# Patient Record
Sex: Male | Born: 1970 | Race: Black or African American | Hispanic: No | Marital: Single | Smoking: Former smoker
Health system: Southern US, Community
[De-identification: ages and names within clinical notes are randomized; demographics above are authoritative.]

## PROBLEM LIST (undated history)

## (undated) DIAGNOSIS — K649 Unspecified hemorrhoids: Secondary | ICD-10-CM

---

## 2013-10-01 ENCOUNTER — Emergency Department: Payer: Self-pay | Admitting: Emergency Medicine

## 2014-07-31 ENCOUNTER — Emergency Department: Payer: Self-pay | Admitting: Physician Assistant

## 2015-05-14 IMAGING — CR DG WRIST COMPLETE 3+V*R*
1 series · 4 of 4 positions shown · non-contrast
Comparison: None.

CLINICAL DATA: Pain status post trauma

EXAM:
RIGHT WRIST - COMPLETE 3+ VIEW

[Series 1: x wrist pa right · 0.14mm/px · 4 of 4 slices shown]
[im 1/4]
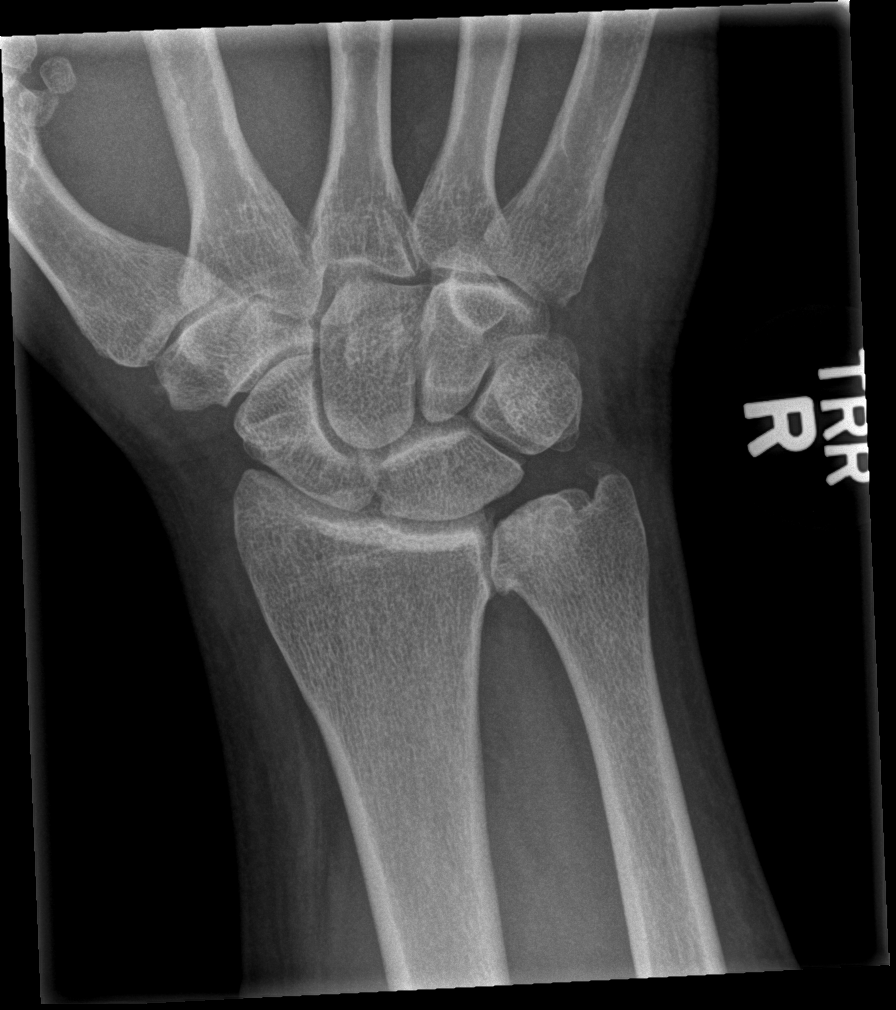
[im 2/4]
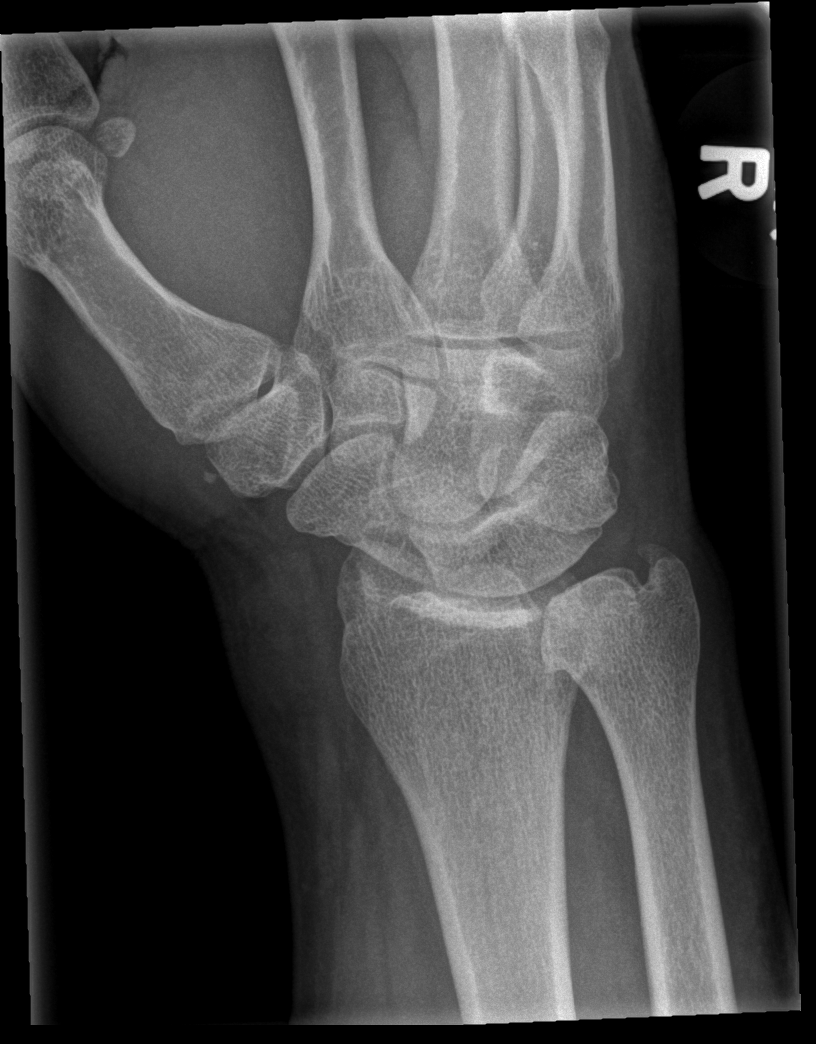
[im 3/4]
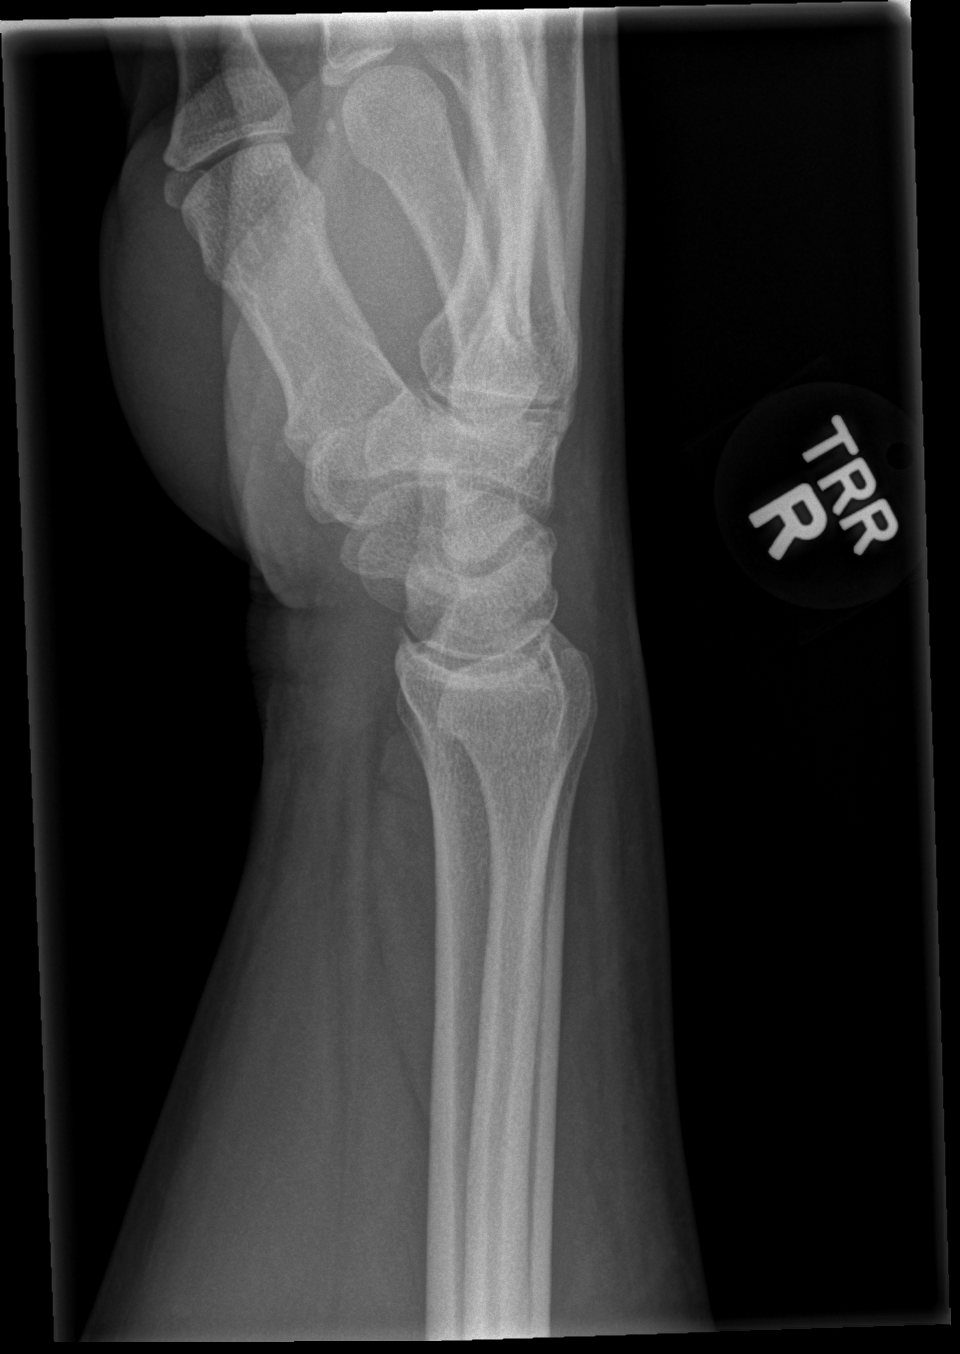
[im 4/4]
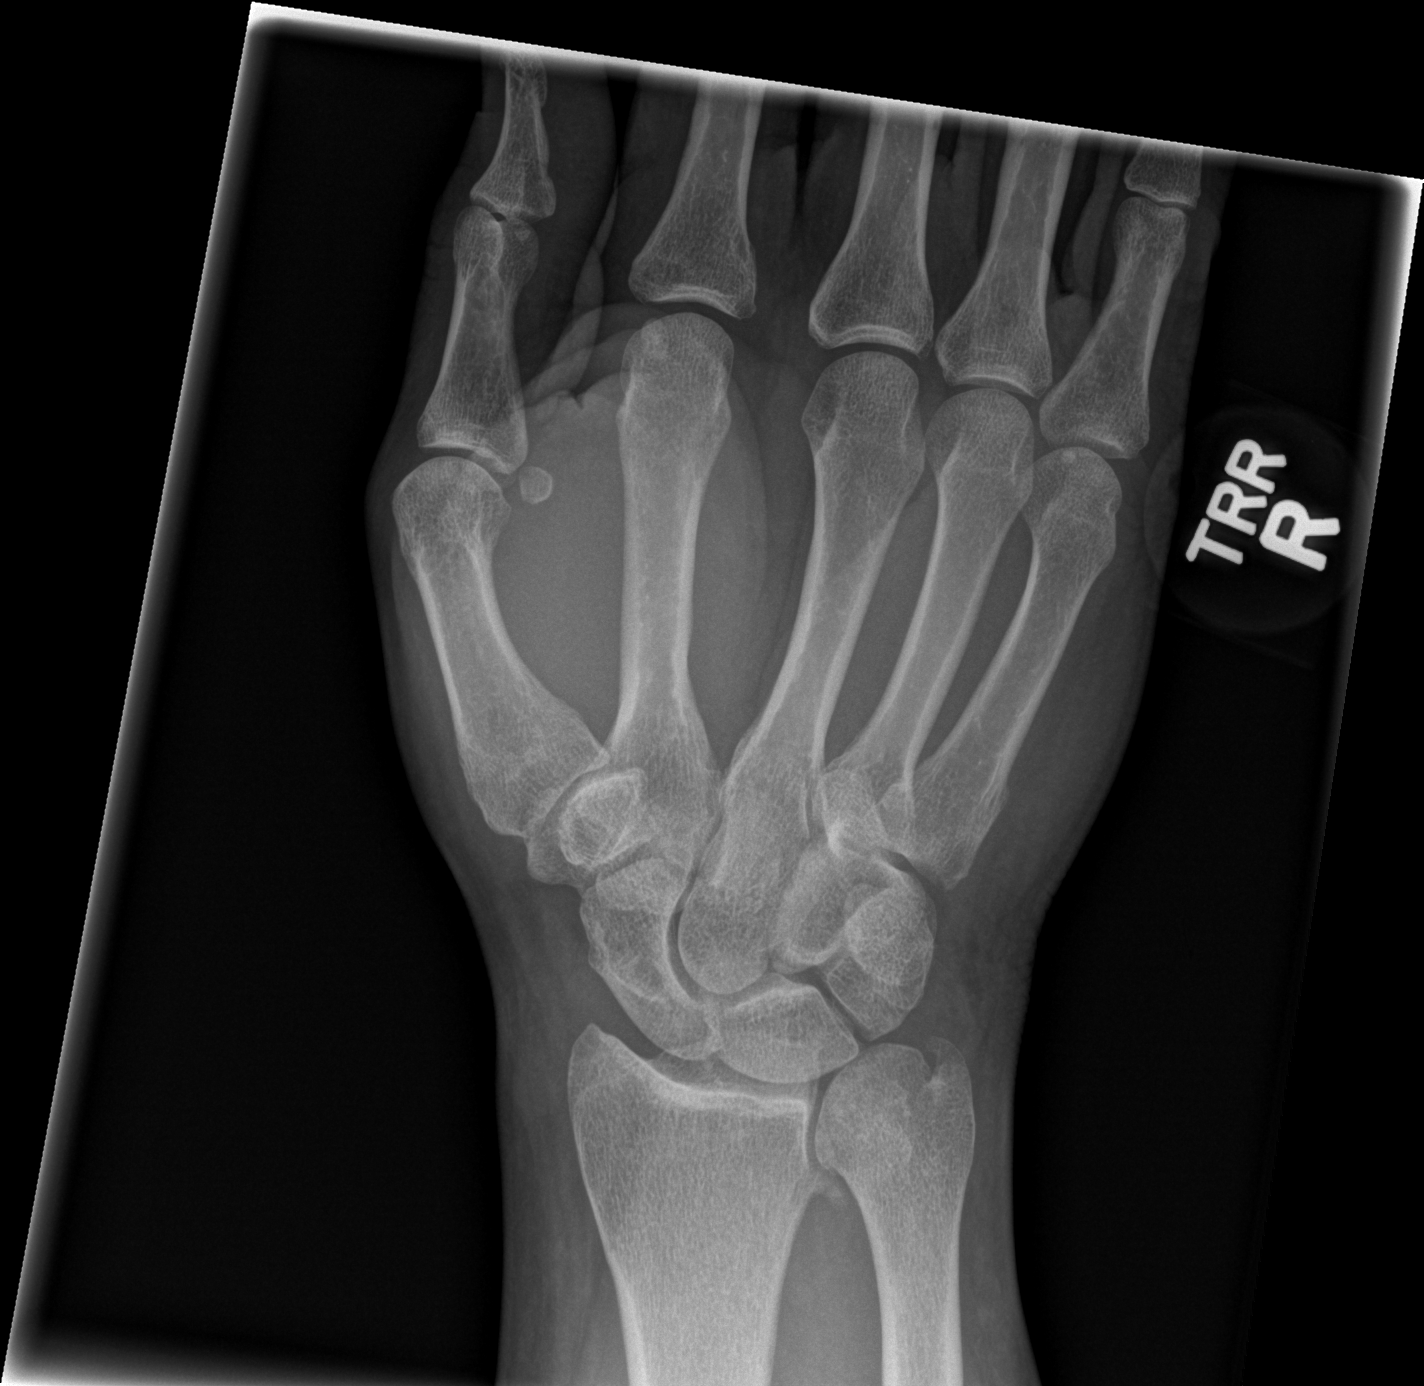

[4 of 4 positions shown; findings below may reference images not displayed]

FINDINGS: There is no evidence of fracture or dislocation. There is no
evidence of arthropathy or other focal bone abnormality. Soft
tissues are unremarkable. Punctate area of increased density within
the base of the soft tissues of the thenar regions likely represents
a small accessory ossicle. No radiopaque soft tissue foreign body
considering the patient's history cannot be excluded.
IMPRESSION: No evidence of acute osseous abnormalities.

## 2016-09-12 ENCOUNTER — Emergency Department
Admission: EM | Admit: 2016-09-12 | Discharge: 2016-09-12 | Disposition: A | Discharge status: Home / Self Care | Payer: Self-pay | Attending: Emergency Medicine | Admitting: Emergency Medicine

## 2016-09-12 ENCOUNTER — Encounter: Payer: Self-pay | Admitting: *Deleted

## 2016-09-12 DIAGNOSIS — H65111 Acute and subacute allergic otitis media (mucoid) (sanguinous) (serous), right ear: Secondary | ICD-10-CM

## 2016-09-12 DIAGNOSIS — H65191 Other acute nonsuppurative otitis media, right ear: Secondary | ICD-10-CM

## 2016-09-12 DIAGNOSIS — F17200 Nicotine dependence, unspecified, uncomplicated: Secondary | ICD-10-CM | POA: Insufficient documentation

## 2016-09-12 MED ORDER — AMOXICILLIN 500 MG PO CAPS
1000.0000 mg | ORAL_CAPSULE | Freq: Once | ORAL | Status: AC
  Administered 2016-09-12: 1000 mg via ORAL
  Filled 2016-09-12: qty 2

## 2016-09-12 MED ORDER — AMOXICILLIN 875 MG PO TABS: 875.0000 mg | ORAL_TABLET | Freq: Two times a day (BID) | ORAL | 0 refills | Status: DC

## 2016-09-12 NOTE — ED Provider Notes (Signed)
[facility] Emergency Department Provider Note  ____________________________________________  Time seen: Approximately 10:09 PM  I have reviewed the triage vital signs and the nursing notes.   HISTORY  Chief Complaint Chills and Otalgia    HPI Julian Brooks is a 46 y.o. male who presents to the ED with complaints of otalgia. He notes a 2 day history of right ear pain, subjective fever, headache, and cough. He denied any SOB, CP, N/V/D, or abdominal pain. He tried taking tylenol around 4 pm today without any relief. He did note sick contacts at work with influenza, and he did not receive his influenza vaccination.    No past medical history on file.  There are no active problems to display for this patient.   No past surgical history on file.  Prior to Admission medications   Medication Sig Start Date End Date Taking? Authorizing Provider  amoxicillin (AMOXIL) 875 MG tablet Take 1 tablet (875 mg total) by mouth 2 (two) times daily. 09/12/16   Delorise Royals Yaquelin Langelier, PA-C    Allergies Patient has no known allergies.  No family history on file.  Social History Social History  Substance Use Topics  . Smoking status: Current Every Day Smoker  . Smokeless tobacco: Current User  . Alcohol use No     Review of Systems  Constitutional: Positive for subjective fever, positive for body aches Eyes: No visual changes. No discharge ENT: Positive for otalgia Cardiovascular: no chest pain. Respiratory: positive for cough. No SOB. Gastrointestinal: No abdominal pain.  No nausea, no vomiting.  No diarrhea.  No constipation. Musculoskeletal: Negative for musculoskeletal pain. Skin: Negative for rash, abrasions, lacerations, ecchymosis. Neurological: Positive for Headache, Negative for focal weakness or numbness. 10-point ROS otherwise negative.  ____________________________________________   PHYSICAL EXAM:  VITAL SIGNS: ED Triage Vitals  Enc Vitals  Group     BP 09/12/16 2007 (!) 146/88     Pulse Rate 09/12/16 2007 (!) 105     Resp 09/12/16 2007 18     Temp 09/12/16 2007 98.7 F (37.1 C)     Temp Source 09/12/16 2007 Oral     SpO2 09/12/16 2007 98 %     Weight 09/12/16 2007 228 lb (103.4 kg)     Height 09/12/16 2007 5\' 9"  (1.753 m)     Head Circumference --      Peak Flow --      Pain Score 09/12/16 2010 6     Pain Loc --      Pain Edu? --      Excl. in GC? --      Constitutional: Alert and oriented. Well appearing and in no acute distress. Eyes: Conjunctivae are normal. PERRL. EOMI. Head: Atraumatic. ENT:      Ears: Right TM is erythematous with effusion, Left TM with effusion but without erythema.       Nose: No congestion/rhinnorhea.      Mouth/Throat: Mucous membranes are moist. No tonsilar exudates or erythema.  Neck: No stridor.   Lymphatic: No cervical lymphadenopathy Cardiovascular: Normal rate, regular rhythm. Normal S1 and S2.  Good peripheral circulation. Respiratory: Normal respiratory effort without tachypnea or retractions. Lungs CTAB. Good air entry to the bases with no decreased or absent breath sounds. Musculoskeletal: Full range of motion to all extremities. No gross deformities appreciated. Neurologic:  Normal speech and language. No gross focal neurologic deficits are appreciated.  Skin:  Skin is warm, dry and intact. No rash noted. Psychiatric: Mood and affect are normal.  Speech and behavior are normal. Patient exhibits appropriate insight and judgement.   ____________________________________________   LABS (all labs ordered are listed, but only abnormal results are displayed)  Labs Reviewed - No data to display ____________________________________________  EKG   ____________________________________________  RADIOLOGY   No results found.  ____________________________________________    PROCEDURES  Procedure(s) performed:    Procedures    Medications  amoxicillin (AMOXIL)  capsule 1,000 mg (not administered)     ____________________________________________   INITIAL IMPRESSION / ASSESSMENT AND PLAN / ED COURSE  Pertinent labs & imaging results that were available during my care of the patient were reviewed by me and considered in my medical decision making (see chart for details).  Review of the  CSRS was performed in accordance of the [facility] prior to dispensing any controlled drugs.     Patient's diagnosis is consistent with right-sided otitis media. Given first dose of antibiotics in the emergency department. Patient will be discharged home with prescriptions for amoxicillin. Patient is to follow up with primary care as needed or otherwise directed. Patient is given ED precautions to return to the ED for any worsening or new symptoms.     ____________________________________________  FINAL CLINICAL IMPRESSION(S) / ED DIAGNOSES  Final diagnoses:  Acute mucoid otitis media of right ear      NEW MEDICATIONS STARTED DURING THIS VISIT:  New Prescriptions   AMOXICILLIN (AMOXIL) 875 MG TABLET    Take 1 tablet (875 mg total) by mouth 2 (two) times daily.        This chart was dictated using voice recognition software/Dragon. Despite best efforts to proofread, errors can occur which can change the meaning. Any change was purely unintentional.    Racheal PatchesJonathan D Shanice Poznanski, PA-C 09/12/16 2230    Jene Everyobert Kinner, MD 09/12/16 [phone]

## 2016-09-12 NOTE — ED Triage Notes (Signed)
Pt has right earache and bodyaches with chills.  Pt alert.  Sx began yesterday.  Taking otc meds without relief.

## 2016-09-12 NOTE — ED Notes (Signed)
Pt stating that his sx began yesterday. Pt stating that he has right ear and eye pain, HA, chills, and generalized body aches. Pt stating a cough at time too. Pt also is presenting with nasal congestion. Pt stating that the HA is "nagging." Pt denying any known fevers at home except for the chills. Pt is laying on stretcher with his family at bedside.

## 2017-01-02 ENCOUNTER — Encounter: Payer: Self-pay | Admitting: Emergency Medicine

## 2017-01-02 ENCOUNTER — Emergency Department: Payer: Self-pay

## 2017-01-02 ENCOUNTER — Emergency Department
Admission: EM | Admit: 2017-01-02 | Discharge: 2017-01-02 | Disposition: A | Discharge status: Home / Self Care | Payer: Self-pay | Attending: Emergency Medicine | Admitting: Emergency Medicine

## 2017-01-02 DIAGNOSIS — F17200 Nicotine dependence, unspecified, uncomplicated: Secondary | ICD-10-CM | POA: Insufficient documentation

## 2017-01-02 DIAGNOSIS — T148XXA Other injury of unspecified body region, initial encounter: Secondary | ICD-10-CM

## 2017-01-02 DIAGNOSIS — Y929 Unspecified place or not applicable: Secondary | ICD-10-CM | POA: Insufficient documentation

## 2017-01-02 DIAGNOSIS — X500XXA Overexertion from strenuous movement or load, initial encounter: Secondary | ICD-10-CM | POA: Insufficient documentation

## 2017-01-02 DIAGNOSIS — Y9389 Activity, other specified: Secondary | ICD-10-CM | POA: Insufficient documentation

## 2017-01-02 DIAGNOSIS — S39012A Strain of muscle, fascia and tendon of lower back, initial encounter: Secondary | ICD-10-CM | POA: Insufficient documentation

## 2017-01-02 DIAGNOSIS — Y999 Unspecified external cause status: Secondary | ICD-10-CM | POA: Insufficient documentation

## 2017-01-02 MED ORDER — HYDROCODONE-ACETAMINOPHEN 5-325 MG PO TABS: 1.0000 | ORAL_TABLET | Freq: Four times a day (QID) | ORAL | 0 refills | Status: DC | PRN

## 2017-01-02 MED ORDER — LIDOCAINE 5 % EX PTCH
1.0000 | MEDICATED_PATCH | CUTANEOUS | Status: DC
  Administered 2017-01-02: 1 via TRANSDERMAL
  Filled 2017-01-02: qty 1

## 2017-01-02 MED ORDER — ACETAMINOPHEN 325 MG PO TABS
650.0000 mg | ORAL_TABLET | Freq: Once | ORAL | Status: AC
  Administered 2017-01-02: 650 mg via ORAL
  Filled 2017-01-02: qty 2

## 2017-01-02 MED ORDER — KETOROLAC TROMETHAMINE 60 MG/2ML IM SOLN
30.0000 mg | Freq: Once | INTRAMUSCULAR | Status: AC
  Administered 2017-01-02: 30 mg via INTRAMUSCULAR
  Filled 2017-01-02: qty 2

## 2017-01-02 NOTE — ED Triage Notes (Signed)
Pt was lifting washer by himself 2 days ago and now c/o lower back pain. Worse when moving/walking.  No loss bowel or bladder.

## 2017-01-02 NOTE — ED Provider Notes (Signed)
[facility] Emergency Department Provider Note  ____________________________________________  Time seen: Approximately 1:12 PM  I have reviewed the triage vital signs and the nursing notes.   HISTORY  Chief Complaint Back Pain    HPI Julian Brooks is a 46 y.o. male that presents to the emergency department with low back pain for 2 days. Patient states that he was lifting a washer by himself and immediately had pain after.Pain is in his low back in the center and does not radiate. He has not had difficulties with his back previously. He took "small pills" that he had at work for pain, which did not help. He denies headache, shortness of breath, chest pain, nausea, vomiting, abdominal pain, bowel or bladder dysfunction, saddle paresthesias.   History reviewed. No pertinent past medical history.  There are no active problems to display for this patient.   History reviewed. No pertinent surgical history.  Prior to Admission medications   Medication Sig Start Date End Date Taking? Authorizing Provider  amoxicillin (AMOXIL) 875 MG tablet Take 1 tablet (875 mg total) by mouth 2 (two) times daily. 09/12/16   Cuthriell, Delorise Royals, PA-C  HYDROcodone-acetaminophen (NORCO/VICODIN) 5-325 MG tablet Take 1 tablet by mouth every 6 (six) hours as needed for moderate pain. 01/02/17   Enid Derry, PA-C    Allergies Patient has no known allergies.  History reviewed. No pertinent family history.  Social History Social History  Substance Use Topics  . Smoking status: Current Every Day Smoker  . Smokeless tobacco: Current User  . Alcohol use No     Review of Systems  Constitutional: No fever/chills Cardiovascular: No chest pain. Respiratory: No SOB. Gastrointestinal: No abdominal pain.  No nausea, no vomiting.  Musculoskeletal: Positive for back pain. Skin: Negative for rash, abrasions, lacerations, ecchymosis. Neurological: Negative for headaches, numbness or  tingling   ____________________________________________   PHYSICAL EXAM:  VITAL SIGNS: ED Triage Vitals  Enc Vitals Group     BP 01/02/17 1223 113/81     Pulse Rate 01/02/17 1223 77     Resp 01/02/17 1223 20     Temp 01/02/17 1223 98.2 F (36.8 C)     Temp Source 01/02/17 1223 Oral     SpO2 01/02/17 1223 96 %     Weight 01/02/17 1220 228 lb (103.4 kg)     Height 01/02/17 1220 5\' 9"  (1.753 m)     Head Circumference --      Peak Flow --      Pain Score 01/02/17 1219 7     Pain Loc --      Pain Edu? --      Excl. in GC? --      Constitutional: Alert and oriented. Well appearing and in no acute distress. Eyes: Conjunctivae are normal. PERRL. EOMI. Head: Atraumatic. ENT:      Ears:      Nose: No congestion/rhinnorhea.      Mouth/Throat: Mucous membranes are moist.  Neck: No stridor. Cardiovascular: Normal rate, regular rhythm.  Good peripheral circulation. Respiratory: Normal respiratory effort without tachypnea or retractions. Lungs CTAB. Good air entry to the bases with no decreased or absent breath sounds. Gastrointestinal: Bowel sounds 4 quadrants. Soft and nontender to palpation. No guarding or rigidity. No palpable masses. No distention.  Musculoskeletal: Full range of motion to all extremities. No gross deformities appreciated. Tenderness to palpation over lumbar spine and paraspinal muscles.. Neurologic:  Normal speech and language. No gross focal neurologic deficits are appreciated.  Skin:  Skin is warm, dry and intact. No rash noted. Psychiatric: Mood and affect are normal. Speech and behavior are normal. Patient exhibits appropriate insight and judgement.   ____________________________________________   LABS (all labs ordered are listed, but only abnormal results are displayed)  Labs Reviewed - No data to display ____________________________________________  EKG   ____________________________________________  RADIOLOGY Lexine BatonI, Hadriel Northup, personally  viewed and evaluated these images (plain radiographs) as part of my medical decision making, as well as reviewing the written report by the radiologist.  Dg Lumbar Spine Complete  Result Date: 01/02/2017 CLINICAL DATA:  Lifted heavy washer 2 days ago. Now with low back pain, worse with movement. EXAM: LUMBAR SPINE - COMPLETE 4+ VIEW COMPARISON:  None. FINDINGS: Five non rib-bearing lumbar-type vertebral bodies are intact and aligned with straightened at lumbar lordosis. Intervertebral disc heights are normal, multilevel ventral endplate spurring. Asymmetrically dense RIGHT L5 vertebral body. Sacroiliac joints are symmetric. Included prevertebral and paraspinal soft tissue planes are non-suspicious. Rounded calcification projecting RIGHT upper quadrant suggest cholelithiasis. IMPRESSION: No acute fracture deformity or malalignment. Probable bone island L5 though, he of there is a history of cancer consider bone scan. Suspected cholelithiasis. Electronically Signed   By: Awilda Metroourtnay  Bloomer M.D.   On: 01/02/2017 13:44    ____________________________________________    PROCEDURES  Procedure(s) performed:    Procedures    Medications  lidocaine (LIDODERM) 5 % 1 patch (1 patch Transdermal Patch Applied 01/02/17 1312)  acetaminophen (TYLENOL) tablet 650 mg (650 mg Oral Given 01/02/17 1312)  ketorolac (TORADOL) injection 30 mg (30 mg Intramuscular Given 01/02/17 1423)     ____________________________________________   INITIAL IMPRESSION / ASSESSMENT AND PLAN / ED COURSE  Pertinent labs & imaging results that were available during my care of the patient were reviewed by me and considered in my medical decision making (see chart for details).  Review of the [place] CSRS was performed in accordance of the [facility] prior to dispensing any controlled drugs.  Patient's diagnosis is consistent with musculoskeletal pain. Vital signs and exam are reassuring. X-ray negative for acute bony abnormalities. Findings  of probable bony [place] were discussed with patient. Patient does not have a history of cancer. He will follow-up with his PCP regarding this. No bowel or bladder dysfunction or saddle paresthesias. He felt better after Toradol and Tylenol. Patient will be discharged home with prescriptions for a short course of Vicodin. Patient is to follow up with PCP as directed. Patient is given ED precautions to return to the ED for any worsening or new symptoms.     ____________________________________________  FINAL CLINICAL IMPRESSION(S) / ED DIAGNOSES  Final diagnoses:  Muscle strain      NEW MEDICATIONS STARTED DURING THIS VISIT:  Discharge Medication List as of 01/02/2017  3:05 PM    START taking these medications   Details  HYDROcodone-acetaminophen (NORCO/VICODIN) 5-325 MG tablet Take 1 tablet by mouth every 6 (six) hours as needed for moderate pain., Starting Thu 01/02/2017, Print            This chart was dictated using voice recognition software/Dragon. Despite best efforts to proofread, errors can occur which can change the meaning. Any change was purely unintentional.    Enid DerryWagner, Khira Cudmore, PA-C 01/02/17 1557    Governor RooksLord, Rebecca, MD 01/12/17 1120

## 2017-04-24 ENCOUNTER — Encounter: Payer: Self-pay | Admitting: Emergency Medicine

## 2017-04-24 ENCOUNTER — Emergency Department
Admission: EM | Admit: 2017-04-24 | Discharge: 2017-04-24 | Disposition: A | Discharge status: Home / Self Care | Payer: Self-pay | Attending: Emergency Medicine | Admitting: Emergency Medicine

## 2017-04-24 DIAGNOSIS — Y990 Civilian activity done for income or pay: Secondary | ICD-10-CM | POA: Insufficient documentation

## 2017-04-24 DIAGNOSIS — F17200 Nicotine dependence, unspecified, uncomplicated: Secondary | ICD-10-CM | POA: Insufficient documentation

## 2017-04-24 DIAGNOSIS — X58XXXA Exposure to other specified factors, initial encounter: Secondary | ICD-10-CM | POA: Insufficient documentation

## 2017-04-24 DIAGNOSIS — Y929 Unspecified place or not applicable: Secondary | ICD-10-CM | POA: Insufficient documentation

## 2017-04-24 DIAGNOSIS — S39012A Strain of muscle, fascia and tendon of lower back, initial encounter: Secondary | ICD-10-CM | POA: Insufficient documentation

## 2017-04-24 DIAGNOSIS — Y9389 Activity, other specified: Secondary | ICD-10-CM | POA: Insufficient documentation

## 2017-04-24 MED ORDER — CYCLOBENZAPRINE HCL 5 MG PO TABS: 5.0000 mg | ORAL_TABLET | Freq: Every day | ORAL | 0 refills | Status: DC

## 2017-04-24 MED ORDER — DICLOFENAC SODIUM 75 MG PO TBEC: 75.0000 mg | DELAYED_RELEASE_TABLET | Freq: Two times a day (BID) | ORAL | 1 refills | Status: DC

## 2017-04-24 MED ORDER — DICLOFENAC SODIUM 75 MG PO TBEC
75.0000 mg | DELAYED_RELEASE_TABLET | Freq: Once | ORAL | Status: AC
  Administered 2017-04-24: 75 mg via ORAL
  Filled 2017-04-24 (×2): qty 1

## 2017-04-24 NOTE — ED Triage Notes (Signed)
Patient presents to the ED with lower back pain that is worse this am.  Patient reports chronic lower back pain.  Patient states he has history of "bone spurs".

## 2017-04-24 NOTE — ED Notes (Signed)
NAD noted at time of D/C. Pt denies questions or concerns. Pt ambulatory to the lobby at this time.  

## 2017-04-24 NOTE — Discharge Instructions (Signed)
Your exam is essentially normal at this time. You have some muscle strain with underlying mild arthritis.Take the prescription meds as directed. [facility] for [facility] symptoms.

## 2017-04-24 NOTE — ED Provider Notes (Signed)
[facility] Emergency Department Provider Note ____________________________________________  Time seen: 1113  I have reviewed the triage vital signs and the nursing notes.  HISTORY  Chief Complaint  Back Pain  HPI Julian Brooks is a 46 y.o. male sent to the ED with complaints of low back pain that is worsened after his shift yesterday.  Patient denies any particular injury, accident, or trauma.  He reports he has not had any interim back pain since he was seen in July for a mechanical injury.  He has been taken Tylenol in the interim but denies any significant benefit.  He denies any distal paresthesias, foot drop, or incontinence.  He describes pain to the midline of the low back that is tender to palpation.  History reviewed. No pertinent past medical history.  There are no active problems to display for this patient.  History reviewed. No pertinent surgical history.  Prior to Admission medications   Medication Sig Start Date End Date Taking? Authorizing Provider  amoxicillin (AMOXIL) 875 MG tablet Take 1 tablet (875 mg total) by mouth 2 (two) times daily. 09/12/16   Cuthriell, Delorise RoyalsJonathan D, PA-C  cyclobenzaprine (FLEXERIL) 5 MG tablet Take 1 tablet (5 mg total) by mouth at bedtime. 04/24/17   Elfrida Pixley, Charlesetta IvoryJenise V Bacon, PA-C  diclofenac (VOLTAREN) 75 MG EC tablet Take 1 tablet (75 mg total) by mouth 2 (two) times daily. 04/24/17   Arkel Cartwright, Charlesetta IvoryJenise V Bacon, PA-C  HYDROcodone-acetaminophen (NORCO/VICODIN) 5-325 MG tablet Take 1 tablet by mouth every 6 (six) hours as needed for moderate pain. 01/02/17   Enid DerryWagner, Ashley, PA-C    Allergies Patient has no known allergies.  No family history on file.  Social History Social History  Substance Use Topics  . Smoking status: Current Every Day Smoker  . Smokeless tobacco: Current User  . Alcohol use No    Review of Systems  Constitutional: Negative for fever. Cardiovascular: Negative for chest pain. Respiratory:  Negative for shortness of breath. Gastrointestinal: Negative for abdominal pain, vomiting and diarrhea. Genitourinary: Negative for dysuria. Musculoskeletal: Positive for back pain. Skin: Negative for rash. Neurological: Negative for headaches, focal weakness or numbness. ____________________________________________  PHYSICAL EXAM:  VITAL SIGNS: ED Triage Vitals  Enc Vitals Group     BP 04/24/17 1029 113/77     Pulse Rate 04/24/17 1029 97     Resp 04/24/17 1029 16     Temp 04/24/17 1029 98.3 F (36.8 C)     Temp Source 04/24/17 1029 Oral     SpO2 04/24/17 1029 99 %     Weight 04/24/17 1021 209 lb (94.8 kg)     Height 04/24/17 1021 5\' 9"  (1.753 m)     Head Circumference --      Peak Flow --      Pain Score 04/24/17 1020 7     Pain Loc --      Pain Edu? --      Excl. in GC? --    Constitutional: Alert and oriented. Well appearing and in no distress. Head: Normocephalic and atraumatic. Cardiovascular: Normal rate, regular rhythm. Normal distal pulses. Respiratory: Normal respiratory effort. No wheezes/rales/rhonchi. Gastrointestinal: Soft and nontender. No distention. Musculoskeletal: Normal spinal alignment without midline tenderness, spasm, deformity, or step-off.  Patient with normal transition from sit to stand.  Normal lumbar extension with some limited lumbar flexion.  He is able to temperature normal toe and heel raise on exam.  Nontender with normal range of motion in all extremities.  Neurologic: Cranial nerves  II through XII grossly intact.  Normal LE DTRs bilaterally.  Normal gait without ataxia. Normal speech and language. No gross focal neurologic deficits are appreciated. Skin:  Skin is warm, dry and intact. No rash noted. ____________________________________________  PROCEDURES  Voltaren 75 mg PO ____________________________________________  INITIAL IMPRESSION / ASSESSMENT AND PLAN / ED COURSE  Presents to the ED for evaluation of acute low back pain.   Patient with what appears to be musculoskeletal pain to the lumbar sacral region.  His exam is overall benign without any acute neuromuscular deficit.  Patient is discharged with a prescription for Voltaren and Flexeril.  Work note is provided for 2 days as requested.  He will follow-up with his provider at [facility] for ongoing symptom management.  Return precautions are reviewed.  ____________________________________________  FINAL CLINICAL IMPRESSION(S) / ED DIAGNOSES  Final diagnoses:  Strain of lumbar region, initial encounter      Lissa Hoard, PA-C 04/24/17 1152    Myrna Blazer, MD 04/24/17 [phone]

## 2017-06-20 ENCOUNTER — Other Ambulatory Visit: Payer: Self-pay

## 2017-06-20 ENCOUNTER — Emergency Department
Admission: EM | Admit: 2017-06-20 | Discharge: 2017-06-20 | Disposition: A | Discharge status: Home / Self Care | Payer: Self-pay | Attending: Emergency Medicine | Admitting: Emergency Medicine

## 2017-06-20 ENCOUNTER — Encounter: Payer: Self-pay | Admitting: Emergency Medicine

## 2017-06-20 DIAGNOSIS — Z79899 Other long term (current) drug therapy: Secondary | ICD-10-CM | POA: Insufficient documentation

## 2017-06-20 DIAGNOSIS — K641 Second degree hemorrhoids: Secondary | ICD-10-CM | POA: Insufficient documentation

## 2017-06-20 DIAGNOSIS — Z87891 Personal history of nicotine dependence: Secondary | ICD-10-CM | POA: Insufficient documentation

## 2017-06-20 HISTORY — DX: Unspecified hemorrhoids: K64.9

## 2017-06-20 MED ORDER — LIDOCAINE HCL 2 % EX GEL
1.0000 "application " | Freq: Once | CUTANEOUS | Status: DC
  Filled 2017-06-20: qty 10

## 2017-06-20 MED ORDER — LIDOCAINE 4 % EX CREA: 1.0000 "application " | TOPICAL_CREAM | CUTANEOUS | 0 refills | Status: DC | PRN

## 2017-06-20 MED ORDER — HYDROCORTISONE ACETATE 25 MG RE SUPP: 25.0000 mg | Freq: Two times a day (BID) | RECTAL | 1 refills | Status: DC

## 2017-06-20 MED ORDER — DOCUSATE SODIUM 100 MG PO CAPS: 100.0000 mg | ORAL_CAPSULE | Freq: Every day | ORAL | 2 refills | Status: AC | PRN

## 2017-06-20 NOTE — ED Notes (Signed)
Pt ambulatory upon discharge. Verbalized understanding of discharge instructions, prescription and follow-up care if symptoms worsen. VSS. Skin warm and dry. A&O x4.

## 2017-06-20 NOTE — ED Provider Notes (Signed)
[facility] Emergency Department Provider Note   ____________________________________________   First MD Initiated Contact with Patient 06/20/17 1313     (approximate)  I have reviewed the triage vital signs and the nursing notes.   HISTORY  Chief Complaint Hemorrhoids    HPI Clerance Julian Brooks is a 46 y.o. male patient complaining of hemorrhoidal pain for 2 days. Patient noted relief with over-the-counter products. Patient rates his pain as a 8/10. Patient described a pain as "throbbing".   Past Medical History:  Diagnosis Date  . Hemorrhoid     There are no active problems to display for this patient.   History reviewed. No pertinent surgical history.  Prior to Admission medications   Medication Sig Start Date End Date Taking? Authorizing Provider  amoxicillin (AMOXIL) 875 MG tablet Take 1 tablet (875 mg total) by mouth 2 (two) times daily. 09/12/16   Cuthriell, Delorise RoyalsJonathan D, PA-C  cyclobenzaprine (FLEXERIL) 5 MG tablet Take 1 tablet (5 mg total) by mouth at bedtime. 04/24/17   Menshew, Charlesetta IvoryJenise V Bacon, PA-C  diclofenac (VOLTAREN) 75 MG EC tablet Take 1 tablet (75 mg total) by mouth 2 (two) times daily. 04/24/17   Menshew, Charlesetta IvoryJenise V Bacon, PA-C  docusate sodium (COLACE) 100 MG capsule Take 1 capsule (100 mg total) by mouth daily as needed. 06/20/17 06/20/18  Joni ReiningSmith, Ronald K, PA-C  HYDROcodone-acetaminophen (NORCO/VICODIN) 5-325 MG tablet Take 1 tablet by mouth every 6 (six) hours as needed for moderate pain. 01/02/17   Enid DerryWagner, Ashley, PA-C  hydrocortisone (ANUSOL-HC) 25 MG suppository Place 1 suppository (25 mg total) rectally every 12 (twelve) hours. 06/20/17 06/20/18  Joni ReiningSmith, Ronald K, PA-C  lidocaine (LMX) 4 % cream Apply 1 application topically as needed. 06/20/17   Joni ReiningSmith, Ronald K, PA-C    Allergies Patient has no known allergies.  History reviewed. No pertinent family history.  Social History Social History   Tobacco Use  . Smoking status:  Former Smoker    Types: Cigarettes  . Smokeless tobacco: Former Engineer, waterUser  Substance Use Topics  . Alcohol use: No  . Drug use: Not on file    Review of Systems Constitutional: No fever/chills Eyes: No visual changes. ENT: No sore throat. Cardiovascular: Denies chest pain. Respiratory: Denies shortness of breath. Gastrointestinal: No abdominal pain.  No nausea, no vomiting.  No diarrhea.  No constipation. Protruding hemorrhoid Genitourinary: Negative for dysuria. Musculoskeletal: Negative for back pain. Skin: Negative for rash. Neurological: Negative for headaches, focal weakness or numbness.   ____________________________________________   PHYSICAL EXAM:  VITAL SIGNS: ED Triage Vitals  Enc Vitals Group     BP 06/20/17 1144 137/80     Pulse Rate 06/20/17 1144 95     Resp 06/20/17 1144 18     Temp 06/20/17 1144 98.2 F (36.8 C)     Temp Source 06/20/17 1142 Oral     SpO2 06/20/17 1144 98 %     Weight 06/20/17 1144 201 lb (91.2 kg)     Height 06/20/17 1144 5\' 9"  (1.753 m)     Head Circumference --      Peak Flow --      Pain Score 06/20/17 1145 8     Pain Loc --      Pain Edu? --      Excl. in GC? --    Constitutional: Alert and oriented. Well appearing and in no acute distress. Cardiovascular: Normal rate, regular rhythm. Grossly normal heart sounds.  Good peripheral circulation. Respiratory: Normal respiratory effort.  No retractions. Lungs CTAB. Gastrointestinal: Soft and nontender. No distention. No abdominal bruits. No CVA tenderness. Protruding non-thrombosed hemorrhoidal tissue. ____________________________________________   LABS (all labs ordered are listed, but only abnormal results are displayed)  Labs Reviewed - No data to display ____________________________________________  EKG   ____________________________________________  RADIOLOGY  No results found.  ____________________________________________   PROCEDURES  Procedure(s) performed:  None  Procedures  Critical Care performed: No  ____________________________________________   INITIAL IMPRESSION / ASSESSMENT AND PLAN / ED COURSE  As part of my medical decision making, I reviewed the following data within the electronic MEDICAL RECORD NUMBER    Acute rectal hemorrhoid; non-thrombosed. Tissue was easily reduced using topical 2% lidocaine. Patient given discharge care instructions. Patient given a prescription for Anusol and topical lidocaine. Patient also advised to use Colace as a stool softener. Follow-up with the surgical clinic if condition persists.      ____________________________________________   FINAL CLINICAL IMPRESSION(S) / ED DIAGNOSES  Final diagnoses:  Grade II hemorrhoids     ED Discharge Orders        Ordered    hydrocortisone (ANUSOL-HC) 25 MG suppository  Every 12 hours     06/20/17 1324    lidocaine (LMX) 4 % cream  As needed     06/20/17 1324    docusate sodium (COLACE) 100 MG capsule  Daily PRN     06/20/17 1324       Note:  This document was prepared using Dragon voice recognition software and may include unintentional dictation errors.    Joni ReiningSmith, Ronald K, PA-C 06/20/17 1337    Arnaldo NatalMalinda, Paul F, MD 06/20/17 [phone]

## 2017-06-20 NOTE — ED Triage Notes (Signed)
Pt has hx of hemorrhoids, pain started about 2 days ago, no hx of excision of hemorrhoids, tried OTC products - preparation H with no relief.

## 2017-06-20 NOTE — ED Notes (Signed)
Pt reports having hemorrhoids but the pain got a lot worse this morning when he woke up. Denies straining for BM.

## 2017-06-20 NOTE — Discharge Instructions (Signed)
Following discharge care instructions and use medications as directed. Follow-up with the surgical clinic if no improvement in one week.

## 2017-08-07 ENCOUNTER — Encounter: Payer: Self-pay | Admitting: Emergency Medicine

## 2017-08-07 ENCOUNTER — Emergency Department
Admission: EM | Admit: 2017-08-07 | Discharge: 2017-08-07 | Disposition: A | Discharge status: Home / Self Care | Payer: Self-pay | Attending: Student in an Organized Health Care Education/Training Program | Admitting: Student in an Organized Health Care Education/Training Program

## 2017-08-07 ENCOUNTER — Other Ambulatory Visit: Payer: Self-pay

## 2017-08-07 DIAGNOSIS — Z79899 Other long term (current) drug therapy: Secondary | ICD-10-CM | POA: Insufficient documentation

## 2017-08-07 DIAGNOSIS — J111 Influenza due to unidentified influenza virus with other respiratory manifestations: Secondary | ICD-10-CM | POA: Insufficient documentation

## 2017-08-07 DIAGNOSIS — Z87891 Personal history of nicotine dependence: Secondary | ICD-10-CM | POA: Insufficient documentation

## 2017-08-07 MED ORDER — ACETAMINOPHEN-CODEINE #3 300-30 MG PO TABS
1.0000 | ORAL_TABLET | Freq: Once | ORAL | Status: AC
  Administered 2017-08-07: 1 via ORAL
  Filled 2017-08-07: qty 1

## 2017-08-07 MED ORDER — ACETAMINOPHEN-CODEINE #3 300-30 MG PO TABS: 1.0000 | ORAL_TABLET | Freq: Four times a day (QID) | ORAL | 0 refills | Status: DC | PRN

## 2017-08-07 MED ORDER — BENZONATATE 100 MG PO CAPS: ORAL_CAPSULE | ORAL | 0 refills | Status: DC

## 2017-08-07 MED ORDER — ONDANSETRON 4 MG PO TBDP: 4.0000 mg | ORAL_TABLET | Freq: Three times a day (TID) | ORAL | 0 refills | Status: DC | PRN

## 2017-08-07 NOTE — ED Notes (Signed)
See triage note  Presents with sore throat and body aches for the past couple of days   Low grade fever on arrival

## 2017-08-07 NOTE — ED Provider Notes (Signed)
[facility] Emergency Department Provider Note ____________________________________________  Time seen: 1450  I have reviewed the triage vital signs and the nursing notes.  HISTORY  Chief Complaint  Sore Throat; Nasal Congestion; and Chills  HPI Julian Brooks is a 47 y.o. male into the ED for evaluation of sudden onset of sore throat, congestion, chills, and body aches.  Patient did not receive the seasonal flu vaccine, reports similar symptoms in his coworkers.  He describes fatigue, malaise, shortness of breath, and intermittent cough.  He denies any nausea, vomiting, or abdominal pain.  Past Medical History:  Diagnosis Date  . Hemorrhoid     There are no active problems to display for this patient.   History reviewed. No pertinent surgical history.  Prior to Admission medications   Medication Sig Start Date End Date Taking? Authorizing Provider  acetaminophen-codeine (TYLENOL #3) 300-30 MG tablet Take 1 tablet by mouth every 6 (six) hours as needed for moderate pain. 08/07/17   Patrcia Schnepp, Charlesetta IvoryJenise V Bacon, PA-C  amoxicillin (AMOXIL) 875 MG tablet Take 1 tablet (875 mg total) by mouth 2 (two) times daily. 09/12/16   Cuthriell, Delorise RoyalsJonathan D, PA-C  benzonatate (TESSALON PERLES) 100 MG capsule Take 1-2 tabs TID prn cough 08/07/17   Asyria Kolander, Charlesetta IvoryJenise V Bacon, PA-C  cyclobenzaprine (FLEXERIL) 5 MG tablet Take 1 tablet (5 mg total) by mouth at bedtime. 04/24/17   Catarina Huntley, Charlesetta IvoryJenise V Bacon, PA-C  diclofenac (VOLTAREN) 75 MG EC tablet Take 1 tablet (75 mg total) by mouth 2 (two) times daily. 04/24/17   Adolphus Hanf, Charlesetta IvoryJenise V Bacon, PA-C  docusate sodium (COLACE) 100 MG capsule Take 1 capsule (100 mg total) by mouth daily as needed. 06/20/17 06/20/18  Joni ReiningSmith, Ronald K, PA-C  HYDROcodone-acetaminophen (NORCO/VICODIN) 5-325 MG tablet Take 1 tablet by mouth every 6 (six) hours as needed for moderate pain. 01/02/17   Enid DerryWagner, Ashley, PA-C  hydrocortisone (ANUSOL-HC) 25 MG suppository Place 1  suppository (25 mg total) rectally every 12 (twelve) hours. 06/20/17 06/20/18  Joni ReiningSmith, Ronald K, PA-C  lidocaine (LMX) 4 % cream Apply 1 application topically as needed. 06/20/17   Joni ReiningSmith, Ronald K, PA-C  ondansetron (ZOFRAN ODT) 4 MG disintegrating tablet Take 1 tablet (4 mg total) by mouth every 8 (eight) hours as needed. 08/07/17   Marshawn Ninneman, Charlesetta IvoryJenise V Bacon, PA-C    Allergies Patient has no known allergies.  No family history on file.  Social History Social History   Tobacco Use  . Smoking status: Former Smoker    Types: Cigarettes  . Smokeless tobacco: Former Engineer, waterUser  Substance Use Topics  . Alcohol use: No  . Drug use: Not on file    Review of Systems  Constitutional: positive for fever and chils. Eyes: Negative for visual changes. ENT: Positive for sore throat. Cardiovascular: Negative for chest pain. Respiratory: Negative for shortness of breath. Gastrointestinal: Negative for abdominal pain, vomiting and diarrhea. Musculoskeletal: Negative for back pain. Skin: Negative for rash. Neurological: Negative for headaches, focal weakness or numbness. ____________________________________________  PHYSICAL EXAM:  VITAL SIGNS: ED Triage Vitals  Enc Vitals Group     BP 08/07/17 1249 97/61     Pulse Rate 08/07/17 1249 (!) 105     Resp 08/07/17 1249 17     Temp 08/07/17 1249 99.7 F (37.6 C)     Temp Source 08/07/17 1249 Oral     SpO2 08/07/17 1249 98 %     Weight 08/07/17 1250 194 lb (88 kg)     Height 08/07/17 1250 5'  8" (1.727 m)     Head Circumference --      Peak Flow --      Pain Score 08/07/17 1305 6     Pain Loc --      Pain Edu? --      Excl. in GC? --     Constitutional: Alert and oriented. Well appearing and in no distress. Head: Normocephalic and atraumatic. Eyes: Conjunctivae are normal. PERRL. Normal extraocular movements Ears: Canals clear. TMs intact bilaterally. Nose: No congestion/rhinorrhea/epistaxis.  Mouth/Throat: Mucous membranes are moist.  Uvula  is midline and tonsils are flat.  No oropharyngeal lesions appreciated.  Generalized erythema is noted. Neck: Supple. No thyromegaly. Hematological/Lymphatic/Immunological: No cervical lymphadenopathy. Cardiovascular: Normal rate, regular rhythm. No murmurs, rubs, or gallops.  Normal distal pulses. Respiratory: Normal respiratory effort. No wheezes/rales/rhonchi. Gastrointestinal: Soft and nontender. No distention. Musculoskeletal: Nontender with normal range of motion in all extremities.  Neurologic:  Normal gait without ataxia. Normal speech and language. No gross focal neurologic deficits are appreciated. Skin:  Skin is warm, dry and intact. No rash noted. ___________________________________________  PROCEDURES  Procedures Tylenol #3 i PO ____________________________________________  INITIAL IMPRESSION / ASSESSMENT AND PLAN / ED COURSE  Patient with ED evaluation of sudden onset of subjective fevers, chills, sore throat, body aches.  Patient clinical picture is consistent with influenza.  Patient has declined influenza screen at this time.  He will be discharged with prescriptions for Tylenol with codeine, Tessalon Perles, and Zofran.  A work note is provided for the remainder of this week.  He should follow-up with his primary provider or return to the ED as discussed. ____________________________________________  FINAL CLINICAL IMPRESSION(S) / ED DIAGNOSES  Final diagnoses:  Influenza      Karmen Stabs, Charlesetta Ivory, PA-C 08/07/17 1525    Willy Eddy, MD 08/07/17 1711

## 2017-08-07 NOTE — Discharge Instructions (Signed)
Your sudden symptoms are consistent with influenza (flu). You should take the prescription meds as directed. Take OTC ibuprofen and cough medicine for additional symptom relief. Drink Powerade/Gatorade to prevent dehydration. [facility] or return as needed.

## 2017-08-07 NOTE — ED Triage Notes (Signed)
Says throat, head congestiln and chills for 2 days.

## 2017-08-09 ENCOUNTER — Emergency Department: Payer: No Typology Code available for payment source

## 2017-08-09 ENCOUNTER — Emergency Department
Admission: EM | Admit: 2017-08-09 | Discharge: 2017-08-09 | Disposition: A | Discharge status: Home / Self Care | Payer: No Typology Code available for payment source | Attending: Emergency Medicine | Admitting: Emergency Medicine

## 2017-08-09 ENCOUNTER — Encounter: Payer: Self-pay | Admitting: Emergency Medicine

## 2017-08-09 ENCOUNTER — Other Ambulatory Visit: Payer: Self-pay

## 2017-08-09 DIAGNOSIS — M25552 Pain in left hip: Secondary | ICD-10-CM | POA: Insufficient documentation

## 2017-08-09 DIAGNOSIS — S79912A Unspecified injury of left hip, initial encounter: Secondary | ICD-10-CM | POA: Diagnosis present

## 2017-08-09 DIAGNOSIS — M25512 Pain in left shoulder: Secondary | ICD-10-CM | POA: Insufficient documentation

## 2017-08-09 DIAGNOSIS — Y92410 Unspecified street and highway as the place of occurrence of the external cause: Secondary | ICD-10-CM | POA: Insufficient documentation

## 2017-08-09 DIAGNOSIS — M545 Low back pain: Secondary | ICD-10-CM | POA: Diagnosis not present

## 2017-08-09 DIAGNOSIS — Y999 Unspecified external cause status: Secondary | ICD-10-CM | POA: Diagnosis not present

## 2017-08-09 DIAGNOSIS — M7918 Myalgia, other site: Secondary | ICD-10-CM

## 2017-08-09 DIAGNOSIS — Y9389 Activity, other specified: Secondary | ICD-10-CM | POA: Diagnosis not present

## 2017-08-09 MED ORDER — KETOROLAC TROMETHAMINE 30 MG/ML IJ SOLN
30.0000 mg | Freq: Once | INTRAMUSCULAR | Status: AC
  Administered 2017-08-09: 30 mg via INTRAMUSCULAR
  Filled 2017-08-09: qty 1

## 2017-08-09 MED ORDER — NAPROXEN 500 MG PO TABS: 500.0000 mg | ORAL_TABLET | Freq: Two times a day (BID) | ORAL | 0 refills | Status: AC

## 2017-08-09 MED ORDER — METHOCARBAMOL 500 MG PO TABS: ORAL_TABLET | ORAL | 0 refills | Status: AC
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
## 2017-08-09 NOTE — Discharge Instructions (Signed)
[facility] if any continued problems.  Also have them recheck your blood pressure as it was elevated in the ED today. Begin taking naproxen 500 mg twice daily with food.  Robaxin 500 mg 1 or 2 tablets every 6 hours as needed for muscle spasms.  Use moist heat or ice to your muscles as needed for discomfort. You normally or sore for 4-5 days

## 2017-09-05 ENCOUNTER — Other Ambulatory Visit: Payer: Self-pay

## 2017-09-05 DIAGNOSIS — M5432 Sciatica, left side: Secondary | ICD-10-CM | POA: Insufficient documentation

## 2017-09-05 DIAGNOSIS — Z87891 Personal history of nicotine dependence: Secondary | ICD-10-CM | POA: Diagnosis not present

## 2017-09-05 DIAGNOSIS — M25552 Pain in left hip: Secondary | ICD-10-CM | POA: Diagnosis present
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
## 2017-09-06 ENCOUNTER — Emergency Department
Admission: EM | Admit: 2017-09-06 | Discharge: 2017-09-06 | Disposition: A | Discharge status: Home / Self Care | Payer: No Typology Code available for payment source | Attending: Emergency Medicine | Admitting: Emergency Medicine

## 2017-09-06 DIAGNOSIS — M5432 Sciatica, left side: Secondary | ICD-10-CM

## 2017-09-06 MED ORDER — METHYLPREDNISOLONE 4 MG PO TBPK: ORAL_TABLET | ORAL | 0 refills | Status: AC

## 2017-09-06 MED ORDER — DIAZEPAM 2 MG PO TABS: 2.0000 mg | ORAL_TABLET | Freq: Three times a day (TID) | ORAL | 0 refills | Status: AC | PRN

## 2017-09-06 MED ORDER — IBUPROFEN 800 MG PO TABS: 800.0000 mg | ORAL_TABLET | Freq: Three times a day (TID) | ORAL | 0 refills | Status: AC | PRN

## 2017-09-06 NOTE — Discharge Instructions (Signed)
1.  Take the following medicines as needed for pain and muscle spasms (Motrin/Valium #15). 2.  Take steroid taper as prescribed (Medrol Dosepak). 3.  Apply moist heat to affected area several times daily. 4.  Return to the ER for worsening symptoms, persistent vomiting, difficulty breathing or other concerns.
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
## 2018-04-27 ENCOUNTER — Emergency Department: Payer: No Typology Code available for payment source

## 2018-04-27 ENCOUNTER — Other Ambulatory Visit: Payer: Self-pay

## 2018-04-27 ENCOUNTER — Encounter: Payer: Self-pay | Admitting: Emergency Medicine

## 2018-04-27 ENCOUNTER — Emergency Department
Admission: EM | Admit: 2018-04-27 | Discharge: 2018-04-27 | Disposition: A | Discharge status: Home / Self Care | Payer: No Typology Code available for payment source | Attending: Emergency Medicine | Admitting: Emergency Medicine

## 2018-04-27 DIAGNOSIS — Y9389 Activity, other specified: Secondary | ICD-10-CM | POA: Diagnosis not present

## 2018-04-27 DIAGNOSIS — Z87891 Personal history of nicotine dependence: Secondary | ICD-10-CM | POA: Insufficient documentation

## 2018-04-27 DIAGNOSIS — Z79899 Other long term (current) drug therapy: Secondary | ICD-10-CM | POA: Diagnosis not present

## 2018-04-27 DIAGNOSIS — Y929 Unspecified place or not applicable: Secondary | ICD-10-CM | POA: Diagnosis not present

## 2018-04-27 DIAGNOSIS — S4991XA Unspecified injury of right shoulder and upper arm, initial encounter: Secondary | ICD-10-CM | POA: Diagnosis present

## 2018-04-27 DIAGNOSIS — S46001A Unspecified injury of muscle(s) and tendon(s) of the rotator cuff of right shoulder, initial encounter: Secondary | ICD-10-CM

## 2018-04-27 DIAGNOSIS — Y999 Unspecified external cause status: Secondary | ICD-10-CM | POA: Diagnosis not present

## 2018-04-27 MED ORDER — ONDANSETRON 8 MG PO TBDP
8.0000 mg | ORAL_TABLET | Freq: Once | ORAL | Status: AC
  Administered 2018-04-27: 8 mg via ORAL
  Filled 2018-04-27: qty 1

## 2018-04-27 MED ORDER — MELOXICAM 15 MG PO TABS: 15.0000 mg | ORAL_TABLET | Freq: Every day | ORAL | 0 refills | Status: AC

## 2018-04-27 MED ORDER — MORPHINE SULFATE (PF) 4 MG/ML IV SOLN
4.0000 mg | Freq: Once | INTRAVENOUS | Status: AC
  Administered 2018-04-27: 4 mg via INTRAMUSCULAR
  Filled 2018-04-27: qty 1

## 2018-04-27 MED ORDER — OXYCODONE-ACETAMINOPHEN 5-325 MG PO TABS: 1.0000 | ORAL_TABLET | Freq: Four times a day (QID) | ORAL | 0 refills | Status: AC | PRN
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
